# Patient Record
Sex: Male | Born: 1985 | Hispanic: Yes | Marital: Married | Smoking: Current some day smoker
Health system: Southern US, Community
[De-identification: ages and names within clinical notes are randomized; demographics above are authoritative.]

## PROBLEM LIST (undated history)

## (undated) DIAGNOSIS — K30 Functional dyspepsia: Secondary | ICD-10-CM

## (undated) HISTORY — PX: NO PAST SURGERIES: SHX2092

---

## 2019-07-02 ENCOUNTER — Ambulatory Visit: Payer: Self-pay | Attending: Internal Medicine

## 2019-07-02 DIAGNOSIS — Z23 Encounter for immunization: Secondary | ICD-10-CM

## 2019-07-02 NOTE — Progress Notes (Signed)
   Covid-19 Vaccination Clinic  Name:  Brandon Baird    MRN: [id number] DOB: 26-Jun-1985  07/02/2019  Mr. Brandon Baird was observed post Covid-19 immunization for 15 minutes without incident. He was provided with Vaccine Information Sheet and instruction to access the V-Safe system.   Mr. Brandon Baird was instructed to call 911 with any severe reactions post vaccine: Marland Kitchen Difficulty breathing  . Swelling of face and throat  . A fast heartbeat  . A bad rash all over body  . Dizziness and weakness   Immunizations Administered    Name Date Dose VIS Date Route   Pfizer COVID-19 Vaccine 07/02/2019  2:51 PM 0.3 mL 03/25/2019 Intramuscular   Manufacturer: [employer], [employer]   Lot: [id number]   NDC: [address]-9

## 2019-07-23 ENCOUNTER — Ambulatory Visit: Payer: Self-pay | Attending: Internal Medicine

## 2019-07-23 DIAGNOSIS — Z23 Encounter for immunization: Secondary | ICD-10-CM

## 2019-07-23 NOTE — Progress Notes (Signed)
   Covid-19 Vaccination Clinic  Name:  Brandon Baird    MRN: [id number] DOB: Dec 17, 1985  07/23/2019  Mr. Brandon Baird was observed post Covid-19 immunization for 15 minutes without incident. He was provided with Vaccine Information Sheet and instruction to access the V-Safe system.   Mr. Brandon Baird was instructed to call 911 with any severe reactions post vaccine: Marland Kitchen Difficulty breathing  . Swelling of face and throat  . A fast heartbeat  . A bad rash all over body  . Dizziness and weakness   Immunizations Administered    Name Date Dose VIS Date Route   Pfizer COVID-19 Vaccine 07/23/2019  3:09 AM 0.3 mL 03/25/2019 Intramuscular   Manufacturer: [employer], [employer]   Lot: [id number]   NDC: [address]-7

## 2020-01-05 ENCOUNTER — Emergency Department: Payer: Self-pay

## 2020-01-05 ENCOUNTER — Ambulatory Visit
Admission: EM | Admit: 2020-01-05 | Discharge: 2020-01-06 | Disposition: A | Discharge status: Home / Self Care | Payer: Self-pay | Attending: Surgery | Admitting: Surgery

## 2020-01-05 ENCOUNTER — Other Ambulatory Visit: Payer: Self-pay

## 2020-01-05 ENCOUNTER — Encounter: Payer: Self-pay | Admitting: Radiology

## 2020-01-05 DIAGNOSIS — K37 Unspecified appendicitis: Secondary | ICD-10-CM | POA: Diagnosis present

## 2020-01-05 DIAGNOSIS — F17200 Nicotine dependence, unspecified, uncomplicated: Secondary | ICD-10-CM | POA: Insufficient documentation

## 2020-01-05 DIAGNOSIS — K760 Fatty (change of) liver, not elsewhere classified: Secondary | ICD-10-CM | POA: Insufficient documentation

## 2020-01-05 DIAGNOSIS — K3580 Unspecified acute appendicitis: Secondary | ICD-10-CM | POA: Insufficient documentation

## 2020-01-05 DIAGNOSIS — K353 Acute appendicitis with localized peritonitis, without perforation or gangrene: Secondary | ICD-10-CM

## 2020-01-05 DIAGNOSIS — Z20822 Contact with and (suspected) exposure to covid-19: Secondary | ICD-10-CM | POA: Insufficient documentation

## 2020-01-05 HISTORY — DX: Functional dyspepsia: K30

## 2020-01-05 LAB — CBC
HCT: 44.7 % (ref 39.0–52.0)
Hemoglobin: 15.4 g/dL (ref 13.0–17.0)
MCH: 30.6 pg (ref 26.0–34.0)
MCHC: 34.5 g/dL (ref 30.0–36.0)
MCV: 88.7 fL (ref 80.0–100.0)
Platelets: 255 10*3/uL (ref 150–400)
RBC: 5.04 MIL/uL (ref 4.22–5.81)
RDW: 12 % (ref 11.5–15.5)
WBC: 8 10*3/uL (ref 4.0–10.5)
nRBC: 0 % (ref 0.0–0.2)

## 2020-01-05 LAB — RESPIRATORY PANEL BY RT PCR (FLU A&B, COVID)
Influenza A by PCR: NEGATIVE
Influenza B by PCR: NEGATIVE
SARS Coronavirus 2 by RT PCR: NEGATIVE

## 2020-01-05 LAB — COMPREHENSIVE METABOLIC PANEL
ALT: 60 U/L — ABNORMAL HIGH (ref 0–44)
AST: 27 U/L (ref 15–41)
Albumin: 4.6 g/dL (ref 3.5–5.0)
Alkaline Phosphatase: 49 U/L (ref 38–126)
Anion gap: 9 (ref 5–15)
BUN: 18 mg/dL (ref 6–20)
CO2: 27 mmol/L (ref 22–32)
Calcium: 9.1 mg/dL (ref 8.9–10.3)
Chloride: 100 mmol/L (ref 98–111)
Creatinine, Ser: 0.85 mg/dL (ref 0.61–1.24)
GFR calc Af Amer: >60 mL/min (ref >60)
GFR calc non Af Amer: >60 mL/min (ref >60)
Glucose, Bld: 106 mg/dL — ABNORMAL HIGH (ref 70–99)
Potassium: 4.2 mmol/L (ref 3.5–5.1)
Sodium: 136 mmol/L (ref 135–145)
Total Bilirubin: 0.7 mg/dL (ref 0.3–1.2)
Total Protein: 8 g/dL (ref 6.5–8.1)

## 2020-01-05 LAB — LIPASE, BLOOD: Lipase: 35 U/L (ref 11–51)

## 2020-01-05 MED ORDER — MORPHINE SULFATE (PF) 2 MG/ML IV SOLN: 2.0000 mg | INTRAVENOUS | Status: DC | PRN

## 2020-01-05 MED ORDER — ONDANSETRON 4 MG PO TBDP: 4.0000 mg | ORAL_TABLET | Freq: Four times a day (QID) | ORAL | Status: DC | PRN

## 2020-01-05 MED ORDER — ONDANSETRON HCL 4 MG/2ML IJ SOLN: 4.0000 mg | Freq: Four times a day (QID) | INTRAMUSCULAR | Status: DC | PRN

## 2020-01-05 MED ORDER — IOHEXOL 350 MG/ML SOLN
100.0000 mL | Freq: Once | INTRAVENOUS | Status: AC | PRN
  Administered 2020-01-05: 100 mL via INTRAVENOUS
  Filled 2020-01-05: qty 100

## 2020-01-05 MED ORDER — LACTATED RINGERS IV BOLUS
1000.0000 mL | Freq: Once | INTRAVENOUS | Status: AC
  Administered 2020-01-05: 1000 mL via INTRAVENOUS

## 2020-01-05 MED ORDER — FENTANYL CITRATE (PF) 100 MCG/2ML IJ SOLN
50.0000 ug | Freq: Once | INTRAMUSCULAR | Status: AC
  Administered 2020-01-05: 50 ug via INTRAVENOUS
  Filled 2020-01-05: qty 2

## 2020-01-05 MED ORDER — SODIUM CHLORIDE 0.9 % IV SOLN: INTRAVENOUS | Status: DC

## 2020-01-05 MED ORDER — SODIUM CHLORIDE 0.9 % IV SOLN
1.0000 g | INTRAVENOUS | Status: DC
  Administered 2020-01-05: 1000 mg via INTRAVENOUS
  Filled 2020-01-05 (×2): qty 1

## 2020-01-05 MED ORDER — HYDROCODONE-ACETAMINOPHEN 5-325 MG PO TABS: 1.0000 | ORAL_TABLET | ORAL | Status: DC | PRN

## 2020-01-05 NOTE — ED Notes (Signed)
See triage note  Presents with some lower abd pain  states pain is on the right side  No fever  Brought over from [facility]

## 2020-01-05 NOTE — ED Notes (Signed)
Pt with LRQ tenderness for "a couple days." Pt states he had this pain a couple weeks ago briefly, but it went away and is now getting worse. Pt denies n/v/d/fever

## 2020-01-05 NOTE — ED Provider Notes (Signed)
[facility] Emergency Department Provider Note  ____________________________________________   First MD Initiated Contact with Patient 01/05/20 1841     (approximate)  I have reviewed the triage vital signs and the nursing notes.   HISTORY  Chief Complaint Abdominal Pain   HPI Brandon Baird is a 34 y.o. male without significant past medical history who presents for assessment approximately 3 to 4 days of right lower quadrant abdominal pain.  No prior similar episodes.  No clear alleviating or aggravating factors.  Patient denies any fevers, chills, cough, nausea, vomiting, diarrhea, dysuria, burning with urination, rash, back pain, extremity pain, or other acute complaints.  No prior abdominal surgeries.  Patient denies EtOH abuse stating only drinks 2 alcoholic beverages on average per night, illicit drug use, or tobacco abuse.       History reviewed. No pertinent past medical history.  There are no problems to display for this patient.   Prior to Admission medications   Not on File    Allergies Patient has no allergy information on record.  No family history on file.  Social History Social History   Tobacco Use  . Smoking status: Not on file  Substance Use Topics  . Alcohol use: Not on file  . Drug use: Not on file    Review of Systems  Review of Systems  Constitutional: Negative for chills and fever.  HENT: Negative for sore throat.   Eyes: Negative for pain.  Respiratory: Negative for cough and stridor.   Cardiovascular: Negative for chest pain.  Gastrointestinal: Positive for abdominal pain. Negative for vomiting.  Genitourinary: Negative for dysuria.  Musculoskeletal: Negative for myalgias.  Skin: Negative for rash.  Neurological: Negative for seizures, loss of consciousness and headaches.  Psychiatric/Behavioral: Negative for suicidal ideas.  All other systems reviewed and are negative.      ____________________________________________   PHYSICAL EXAM:  VITAL SIGNS: ED Triage Vitals  Enc Vitals Group     BP 01/05/20 1644 (!) 151/79     Pulse Rate 01/05/20 1644 (!) 56     Resp 01/05/20 1644 18     Temp 01/05/20 1644 98.1 F (36.7 C)     Temp Source 01/05/20 1644 Oral     SpO2 01/05/20 1644 100 %     Weight 01/05/20 1645 198 lb (89.8 kg)     Height 01/05/20 1645 6' (1.829 m)     Head Circumference --      Peak Flow --      Pain Score 01/05/20 1648 5     Pain Loc --      Pain Edu? --      Excl. in GC? --    Vitals:   01/05/20 1644 01/05/20 1844  BP: (!) 151/79 116/72  Pulse: (!) 56 (!) 52  Resp: 18 16  Temp: 98.1 F (36.7 C)   SpO2: 100% 98%   Physical Exam Vitals and nursing note reviewed.  Constitutional:      Appearance: He is well-developed.  HENT:     Head: Normocephalic and atraumatic.     Right Ear: External ear normal.     Left Ear: External ear normal.     Nose: Nose normal.     Mouth/Throat:     Mouth: Mucous membranes are moist.  Eyes:     Conjunctiva/sclera: Conjunctivae normal.  Cardiovascular:     Rate and Rhythm: Regular rhythm. Bradycardia present.     Heart sounds: No murmur heard.   Pulmonary:  Effort: Pulmonary effort is normal. No respiratory distress.     Breath sounds: Normal breath sounds.  Abdominal:     Palpations: Abdomen is soft.     Tenderness: There is abdominal tenderness in the right lower quadrant. There is guarding. There is no right CVA tenderness or left CVA tenderness.  Musculoskeletal:     Cervical back: Neck supple.  Skin:    General: Skin is warm and dry.     Capillary Refill: Capillary refill takes less than 2 seconds.  Neurological:     Mental Status: He is alert and oriented to person, place, and time.  Psychiatric:        Mood and Affect: Mood normal.       ____________________________________________   LABS (all labs ordered are listed, but only abnormal results are displayed)  Labs  Reviewed  COMPREHENSIVE METABOLIC PANEL - Abnormal; Notable for the following components:      Result Value   Glucose, Bld 106 (*)    ALT 60 (*)    All other components within normal limits  RESPIRATORY PANEL BY RT PCR (FLU A&B, COVID)  LIPASE, BLOOD  CBC  URINALYSIS, COMPLETE (UACMP) WITH MICROSCOPIC   ____________________________________________  ____________________________________________  RADIOLOGY  ED MD interpretation:    Official radiology report(s): CT ABDOMEN PELVIS W CONTRAST  Result Date: 01/05/2020 CLINICAL DATA:  Right lower quadrant pain.  Appendicitis suspected. EXAM: CT ABDOMEN AND PELVIS WITH CONTRAST TECHNIQUE: Multidetector CT imaging of the abdomen and pelvis was performed using the standard protocol following bolus administration of intravenous contrast. CONTRAST:  OMNIPAQUE IOHEXOL 350 MG/ML SOLN COMPARISON:  None. FINDINGS: Lower chest: Hypoventilatory atelectasis in the dependent lungs with subsegmental lingular atelectasis. No confluent airspace disease or pleural effusion. Hepatobiliary: Diffusely decreased hepatic density typical of steatosis. No evidence of focal hepatic lesion. Gallbladder physiologically distended, no calcified stone. No biliary dilatation. Pancreas: No ductal dilatation or inflammation. Spleen: Normal in size without focal abnormality. Adrenals/Urinary Tract: Normal adrenal glands. No hydronephrosis or perinephric edema. Homogeneous renal enhancement. Urinary bladder is physiologically distended without wall thickening. Stomach/Bowel: Early acute appendicitis as described below. Tiny hiatal hernia, stomach otherwise unremarkable. There is no small bowel obstruction or inflammatory change. Terminal ileum is normal. Small volume of colonic stool. No colonic wall thickening or inflammatory change. Appendix: Location: Retrocecal Diameter: 8 mm Appendicolith: No Mucosal hyper-enhancement: No Extraluminal gas: No Periappendiceal collection: Faint  periappendiceal fat stranding but no collection or free fluid. Vascular/Lymphatic: Normal caliber abdominal aorta. Patent portal vein. Mesenteric vessels are patent. No abdominopelvic adenopathy. Reproductive: Prostate is unremarkable. Other: No free air, free fluid, or intra-abdominal fluid collection. Musculoskeletal: There are no acute or suspicious osseous abnormalities. IMPRESSION: 1. Findings consistent with early uncomplicated acute appendicitis. 2. Hepatic steatosis. Electronically Signed   By: Narda Rutherford M.D.   On: 01/05/2020 19:42    ____________________________________________   PROCEDURES  Procedure(s) performed (including Critical Care):  Procedures   ____________________________________________   INITIAL IMPRESSION / ASSESSMENT AND PLAN / ED COURSE        Patient presents with [place] to history exam for assessment of right lower quadrant pain.  Patient is afebrile otherwise hemodynamically stable on arrival with tenderness in the right lower quadrant.  Overall patient's presentation work-up is most consistent with early acute uncomplicated appendicitis without evidence of abscess or perforation.  There is no evidence of pancreatitis, pyelonephritis, kidney stone, cystitis, diverticulitis, or acute cholestasis.  Patient does not appear septic.  He was given IV fluids and below noted  analgesia.  Covid PCR was sent.  I did consult Dr. Zoila Shutter on surgery service who stated he would admit the patient.  ____________________________________________   FINAL CLINICAL IMPRESSION(S) / ED DIAGNOSES  Final diagnoses:  Acute appendicitis with localized peritonitis, without perforation, abscess, or gangrene    Medications  lactated ringers bolus 1,000 mL (1,000 mLs Intravenous New Bag/Given 01/05/20 1902)  fentaNYL (SUBLIMAZE) injection 50 mcg (50 mcg Intravenous Given 01/05/20 1902)  iohexol (OMNIPAQUE) 350 MG/ML injection 100 mL (100 mLs Intravenous Contrast Given 01/05/20 1920)       ED Discharge Orders    None       Note:  This document was prepared using Dragon voice recognition software and may include unintentional dictation errors.   Gilles Chiquito, MD 01/05/20 2020

## 2020-01-05 NOTE — ED Triage Notes (Signed)
Pt here with abd pain and was referred to come to the ED for evaluation of appendicitis. Pt states that he has lower right abd pain. Pt NAD in triage.

## 2020-01-06 ENCOUNTER — Encounter
Admission: EM | Disposition: A | Discharge status: Home / Self Care | Payer: Self-pay | Source: Home / Self Care | Attending: Emergency Medicine

## 2020-01-06 ENCOUNTER — Observation Stay: Payer: Self-pay | Admitting: Registered Nurse

## 2020-01-06 ENCOUNTER — Encounter: Payer: Self-pay | Admitting: Surgery

## 2020-01-06 DIAGNOSIS — K353 Acute appendicitis with localized peritonitis, without perforation or gangrene: Secondary | ICD-10-CM

## 2020-01-06 DIAGNOSIS — K3580 Unspecified acute appendicitis: Secondary | ICD-10-CM

## 2020-01-06 DIAGNOSIS — K35891 Other acute appendicitis without perforation, with gangrene: Secondary | ICD-10-CM

## 2020-01-06 HISTORY — DX: Unspecified acute appendicitis: K35.80

## 2020-01-06 HISTORY — PX: XI ROBOTIC LAPAROSCOPIC ASSISTED APPENDECTOMY: SHX6877

## 2020-01-06 LAB — URINALYSIS, COMPLETE (UACMP) WITH MICROSCOPIC
Bacteria, UA: NONE SEEN
Bilirubin Urine: NEGATIVE
Glucose, UA: NEGATIVE mg/dL
Hgb urine dipstick: NEGATIVE
Ketones, ur: NEGATIVE mg/dL
Leukocytes,Ua: NEGATIVE
Nitrite: NEGATIVE
Protein, ur: NEGATIVE mg/dL
Specific Gravity, Urine: 1.03 (ref 1.005–1.030)
Squamous Epithelial / HPF: NONE SEEN (ref 0–5)
pH: 6 (ref 5.0–8.0)

## 2020-01-06 LAB — HIV ANTIBODY (ROUTINE TESTING W REFLEX): HIV Screen 4th Generation wRfx: NONREACTIVE

## 2020-01-06 SURGERY — APPENDECTOMY, ROBOT-ASSISTED, LAPAROSCOPIC
Anesthesia: General

## 2020-01-06 MED ORDER — MIDAZOLAM HCL 2 MG/2ML IJ SOLN
INTRAMUSCULAR | Status: DC | PRN
  Administered 2020-01-06: 2 mg via INTRAVENOUS

## 2020-01-06 MED ORDER — FENTANYL CITRATE (PF) 100 MCG/2ML IJ SOLN
INTRAMUSCULAR | Status: AC
  Administered 2020-01-06: 25 ug via INTRAVENOUS
  Filled 2020-01-06: qty 2

## 2020-01-06 MED ORDER — IBUPROFEN 800 MG PO TABS: 800.0000 mg | ORAL_TABLET | Freq: Three times a day (TID) | ORAL | 0 refills | Status: AC | PRN

## 2020-01-06 MED ORDER — LIDOCAINE HCL (CARDIAC) PF 100 MG/5ML IV SOSY
PREFILLED_SYRINGE | INTRAVENOUS | Status: DC | PRN
  Administered 2020-01-06: 100 mg via INTRAVENOUS

## 2020-01-06 MED ORDER — ONDANSETRON HCL 4 MG/2ML IJ SOLN
INTRAMUSCULAR | Status: AC
  Filled 2020-01-06: qty 2

## 2020-01-06 MED ORDER — ROCURONIUM BROMIDE 10 MG/ML (PF) SYRINGE
PREFILLED_SYRINGE | INTRAVENOUS | Status: AC
  Filled 2020-01-06: qty 10

## 2020-01-06 MED ORDER — FENTANYL CITRATE (PF) 100 MCG/2ML IJ SOLN
25.0000 ug | INTRAMUSCULAR | Status: DC | PRN
  Administered 2020-01-06 (×3): 25 ug via INTRAVENOUS

## 2020-01-06 MED ORDER — ONDANSETRON HCL 4 MG/2ML IJ SOLN
INTRAMUSCULAR | Status: DC | PRN
  Administered 2020-01-06: 4 mg via INTRAVENOUS

## 2020-01-06 MED ORDER — CHLORHEXIDINE GLUCONATE 0.12 % MT SOLN
OROMUCOSAL | Status: AC
  Filled 2020-01-06: qty 15

## 2020-01-06 MED ORDER — SUGAMMADEX SODIUM 200 MG/2ML IV SOLN
INTRAVENOUS | Status: DC | PRN
  Administered 2020-01-06: 179.6 mg via INTRAVENOUS

## 2020-01-06 MED ORDER — ORAL CARE MOUTH RINSE: 15.0000 mL | Freq: Once | OROMUCOSAL | Status: AC

## 2020-01-06 MED ORDER — GLYCOPYRROLATE 0.2 MG/ML IJ SOLN
INTRAMUSCULAR | Status: DC | PRN
  Administered 2020-01-06: .2 mg via INTRAVENOUS

## 2020-01-06 MED ORDER — ACETAMINOPHEN 10 MG/ML IV SOLN
INTRAVENOUS | Status: AC
  Filled 2020-01-06: qty 100

## 2020-01-06 MED ORDER — PROPOFOL 10 MG/ML IV BOLUS
INTRAVENOUS | Status: DC | PRN
  Administered 2020-01-06: 200 mg via INTRAVENOUS

## 2020-01-06 MED ORDER — DEXAMETHASONE SODIUM PHOSPHATE 10 MG/ML IJ SOLN
INTRAMUSCULAR | Status: DC | PRN
  Administered 2020-01-06: 10 mg via INTRAVENOUS

## 2020-01-06 MED ORDER — FENTANYL CITRATE (PF) 100 MCG/2ML IJ SOLN
INTRAMUSCULAR | Status: AC
  Filled 2020-01-06: qty 2

## 2020-01-06 MED ORDER — ACETAMINOPHEN 10 MG/ML IV SOLN
INTRAVENOUS | Status: DC | PRN
  Administered 2020-01-06: 1000 mg via INTRAVENOUS

## 2020-01-06 MED ORDER — DEXAMETHASONE SODIUM PHOSPHATE 10 MG/ML IJ SOLN
INTRAMUSCULAR | Status: AC
  Filled 2020-01-06: qty 1

## 2020-01-06 MED ORDER — FENTANYL CITRATE (PF) 100 MCG/2ML IJ SOLN
INTRAMUSCULAR | Status: DC | PRN
Start: 2020-01-06 — End: 2020-01-06
  Administered 2020-01-06: 50 ug via INTRAVENOUS
  Administered 2020-01-06 (×2): 25 ug via INTRAVENOUS

## 2020-01-06 MED ORDER — HYDROCODONE-ACETAMINOPHEN 5-325 MG PO TABS
1.0000 | ORAL_TABLET | ORAL | 0 refills | Status: AC | PRN
Start: 2020-01-06 — End: ?

## 2020-01-06 MED ORDER — BUPIVACAINE LIPOSOME 1.3 % IJ SUSP
INTRAMUSCULAR | Status: DC | PRN
  Administered 2020-01-06: 20 mL

## 2020-01-06 MED ORDER — CHLORHEXIDINE GLUCONATE 0.12 % MT SOLN
15.0000 mL | Freq: Once | OROMUCOSAL | Status: AC
  Administered 2020-01-06: 15 mL via OROMUCOSAL

## 2020-01-06 MED ORDER — ONDANSETRON HCL 4 MG/2ML IJ SOLN: 4.0000 mg | Freq: Once | INTRAMUSCULAR | Status: DC | PRN

## 2020-01-06 MED ORDER — LIDOCAINE HCL (PF) 2 % IJ SOLN
INTRAMUSCULAR | Status: AC
  Filled 2020-01-06: qty 5

## 2020-01-06 MED ORDER — BUPIVACAINE-EPINEPHRINE (PF) 0.25% -1:200000 IJ SOLN
INTRAMUSCULAR | Status: DC | PRN
  Administered 2020-01-06: 30 mL

## 2020-01-06 MED ORDER — GLYCOPYRROLATE 0.2 MG/ML IJ SOLN
INTRAMUSCULAR | Status: AC
  Filled 2020-01-06: qty 1

## 2020-01-06 MED ORDER — LACTATED RINGERS IV SOLN: INTRAVENOUS | Status: DC | PRN

## 2020-01-06 MED ORDER — PROPOFOL 10 MG/ML IV BOLUS
INTRAVENOUS | Status: AC
  Filled 2020-01-06: qty 20

## 2020-01-06 MED ORDER — ROCURONIUM BROMIDE 100 MG/10ML IV SOLN
INTRAVENOUS | Status: DC | PRN
  Administered 2020-01-06: 50 mg via INTRAVENOUS

## 2020-01-06 MED ORDER — MIDAZOLAM HCL 2 MG/2ML IJ SOLN
INTRAMUSCULAR | Status: AC
  Filled 2020-01-06: qty 2

## 2020-01-06 SURGICAL SUPPLY — 38 items
BLADE CLIPPER SURG (BLADE) ×3 IMPLANT
CHLORAPREP W/TINT 26 (MISCELLANEOUS) ×3 IMPLANT
COVER TIP SHEARS 8 DVNC (MISCELLANEOUS) ×1 IMPLANT
COVER TIP SHEARS 8MM DA VINCI (MISCELLANEOUS) ×2
COVER WAND RF STERILE (DRAPES) ×3 IMPLANT
DECANTER SPIKE VIAL GLASS SM (MISCELLANEOUS) ×3 IMPLANT
DEFOGGER SCOPE WARMER CLEARIFY (MISCELLANEOUS) ×3 IMPLANT
DERMABOND ADVANCED (GAUZE/BANDAGES/DRESSINGS) ×2
DERMABOND ADVANCED .7 DNX12 (GAUZE/BANDAGES/DRESSINGS) ×1 IMPLANT
DRAPE ARM DVNC X/XI (DISPOSABLE) ×4 IMPLANT
DRAPE COLUMN DVNC XI (DISPOSABLE) ×1 IMPLANT
DRAPE DA VINCI XI ARM (DISPOSABLE) ×8
DRAPE DA VINCI XI COLUMN (DISPOSABLE) ×2
GLOVE ORTHO TXT STRL SZ7.5 (GLOVE) ×6 IMPLANT
GOWN STRL REUS W/ TWL LRG LVL3 (GOWN DISPOSABLE) ×4 IMPLANT
GOWN STRL REUS W/TWL LRG LVL3 (GOWN DISPOSABLE) ×8
GRASPER SUT TROCAR 14GX15 (MISCELLANEOUS) ×3 IMPLANT
KIT PINK PAD W/HEAD ARE REST (MISCELLANEOUS) ×3
KIT PINK PAD W/HEAD ARM REST (MISCELLANEOUS) ×1 IMPLANT
KIT TURNOVER KIT A (KITS) ×3 IMPLANT
LABEL OR SOLS (LABEL) ×3 IMPLANT
NEEDLE HYPO 22GX1.5 SAFETY (NEEDLE) ×3 IMPLANT
NEEDLE INSUFFLATION 14GA 120MM (NEEDLE) ×3 IMPLANT
NS IRRIG 500ML POUR BTL (IV SOLUTION) ×3 IMPLANT
PACK LAP CHOLECYSTECTOMY (MISCELLANEOUS) ×3 IMPLANT
POUCH SPECIMEN RETRIEVAL 10MM (ENDOMECHANICALS) ×3 IMPLANT
SEAL CANN UNIV 5-8 DVNC XI (MISCELLANEOUS) ×3 IMPLANT
SEAL XI 5MM-8MM UNIVERSAL (MISCELLANEOUS) ×6
SET TUBE SMOKE EVAC HIGH FLOW (TUBING) ×3 IMPLANT
SOLUTION ELECTROLUBE (MISCELLANEOUS) ×3 IMPLANT
SUT MNCRL 4-0 (SUTURE) ×4
SUT MNCRL 4-0 27XMFL (SUTURE) ×2
SUT VIC AB 2-0 SH 27 (SUTURE) ×2
SUT VIC AB 2-0 SH 27XBRD (SUTURE) ×1 IMPLANT
SUT VICRYL 0 AB UR-6 (SUTURE) ×3 IMPLANT
SUTURE MNCRL 4-0 27XMF (SUTURE) ×2 IMPLANT
TRAY FOLEY SLVR 16FR LF STAT (SET/KITS/TRAYS/PACK) ×3 IMPLANT
TROCAR Z-THREAD FIOS 11X100 BL (TROCAR) ×3 IMPLANT

## 2020-01-06 NOTE — Transfer of Care (Signed)
Immediate Anesthesia Transfer of Care Note  Patient: [facility]  Procedure(s) Performed: Procedure(s): XI ROBOTIC LAPAROSCOPIC ASSISTED APPENDECTOMY (N/A)  Patient Location: PACU  Anesthesia Type:General  Level of Consciousness: sedated  Airway & Oxygen Therapy: Patient Spontanous Breathing and Patient connected to face mask oxygen  Post-op Assessment: Report given to RN and Post -op Vital signs reviewed and stable  Post vital signs: Reviewed and stable  Last Vitals:  Vitals:   01/06/20 1143 01/06/20 1340  BP: 135/74 131/71  Pulse: 74 95  Resp: 14 17  Temp: 36.8 C (P) 36.4 C  SpO2: 100% 100%    Complications: No apparent anesthesia complications

## 2020-01-06 NOTE — Anesthesia Postprocedure Evaluation (Signed)
Anesthesia Post Note  Patient: Brandon Baird  Procedure(s) Performed: XI ROBOTIC LAPAROSCOPIC ASSISTED APPENDECTOMY (N/A )  Patient location during evaluation: PACU Anesthesia Type: General Level of consciousness: awake and alert Pain management: pain level controlled Vital Signs Assessment: post-procedure vital signs reviewed and stable Respiratory status: spontaneous breathing and respiratory function stable Cardiovascular status: stable Anesthetic complications: no   No complications documented.   Last Vitals:  Vitals:   01/06/20 1143 01/06/20 1340  BP: 135/74 131/71  Pulse: 74 95  Resp: 14 17  Temp: 36.8 C 36.4 C  SpO2: 100% 100%    Last Pain:  Vitals:   01/06/20 1143  TempSrc: Temporal  PainSc: 0-No pain                 Carlosdaniel Grob K

## 2020-01-06 NOTE — Anesthesia Procedure Notes (Signed)
Procedure Name: Intubation Date/Time: 01/06/2020 12:06 PM Performed by: Doreen Salvage, CRNA Pre-anesthesia Checklist: Patient identified, Patient being monitored, Timeout performed, Emergency Drugs available and Suction available Patient Re-evaluated:Patient Re-evaluated prior to induction Oxygen Delivery Method: Circle system utilized Preoxygenation: Pre-oxygenation with 100% oxygen Induction Type: IV induction Ventilation: Mask ventilation without difficulty Laryngoscope Size: Mac and 4 Grade View: Grade I Tube type: Oral Tube size: 7.5 mm Number of attempts: 1 Airway Equipment and Method: Stylet Placement Confirmation: ETT inserted through vocal cords under direct vision,  positive ETCO2 and breath sounds checked- equal and bilateral Secured at: 23 cm Tube secured with: Tape Dental Injury: Teeth and Oropharynx as per pre-operative assessment

## 2020-01-06 NOTE — ED Notes (Signed)
Transport arrived to take pt for procedure. Pt surgical consent for completed and sent with pt to procedure.

## 2020-01-06 NOTE — Op Note (Signed)
Robotic appendectomy  Pre-operative Diagnosis: Acute appendicitis  Post-operative Diagnosis: same.    Surgeon: Campbell Lerner, M.D., FACS  Anesthesia: General  Findings: As expected acutely inflamed appendix with normal serosa at the appendical cecal junction.  Estimated Blood Loss: 10 mL         Specimens: Appendix          Complications: none              Procedure Details  The patient was seen again in the Holding Room. The benefits, complications, treatment options, and expected outcomes were discussed with the patient. The risks of bleeding, infection, recurrence of symptoms, failure to resolve symptoms, unanticipated injury, prosthetic placement, prosthetic infection, any of which could require further surgery were reviewed with the patient. The likelihood of improving the patient's symptoms with return to their baseline status is good.  The patient and/or family concurred with the proposed plan, giving informed consent.  The patient was taken to Operating Room, identified and the procedure verified.    Prior to the induction of general anesthesia, antibiotic prophylaxis was administered. VTE prophylaxis was in place.  General anesthesia was then administered and tolerated well. After the induction, the patient was positioned in the supine position and the abdomen was prepped with Chloraprep and draped in the sterile fashion.  A Time Out was held and the above information confirmed.  After local infiltration of quarter percent Marcaine with epinephrine, stab incision was made left upper quadrant.  Just below the costal margin approximately midclavicular line the Veress needle is passed with sensation of the layers to penetrate the abdominal wall and into the peritoneum.  Saline drop test is confirmed peritoneal placement.  Insufflation is initiated with carbon dioxide to pressures of 15 mmHg.  With local anesthetic infiltration, a left lower quadrant incision is made, and an optical  12 mm trocar is passed into the peritoneal cavity under direct visualization.  An additional 8.5 mm robotic trochars placed in the suprapubic area and in the left abdominal wall under direct visualization. Using a force bipolar grasper and monopolar scissors proceeded with dissecting out the soft tissues adjacent to the cecum and appendix to fully identify the appendix, and mobilize it. The mesoappendix was carefully divided utilizing bipolar cautery, monopolar cautery and scissors. With the appendiceal cecal junction fully isolated, the appendiceal base was crimped, a double ligature of 2-0 Vicryl was utilized to ligate the base of the appendix, and the appendix was divided with monopolar scissors, the appendiceal mucosa was then fulgurated with the same. I utilized a 2-0 Vicryl to dunk the appendiceal stump into the cecal serosa, burying the stump with a pursestring suture.  We then placed the appendix in a retrieval bag.  We then undocked the robot and proceeded with completing the procedure laparoscopically and withdrew the Endo Catch bag out the largest port site. We closed the largest port site utilizing PMI and cone with a 0 Vicryl under direct visualization.  The abdomen was then desufflated and the trochars removed. Incisions were then irrigated and closed with subcuticulars of 4-0 Monocryl.  Skin sealed with Dermabond.  Patient tolerated procedure well.    Campbell Lerner M.D., [facility] 01/06/2020 1:32 PM

## 2020-01-06 NOTE — Progress Notes (Signed)
Pt has active order for admission/bed request as well as discharge orders from Dr. Claudine Mouton. Clarified with MD; per Dr. Claudine Mouton, pt will discharge home today. Bed request cancelled.

## 2020-01-06 NOTE — Discharge Summary (Signed)
Physician Discharge Summary  Patient ID: Brandon Baird MRN: [id number] DOB/AGE: 34-Mar-1987 34 y.o.  Admit date: 01/05/2020 Discharge date: 01/06/2020  Admission Diagnoses:  Appendicitis  Discharge Diagnoses:  Active Problems:   Appendicitis   Discharged Condition: good  Hospital Course: Admitted for robotic appendectomy.    Consults: None  Significant Diagnostic Studies: ED workup of CT/Labs  Treatments: surgery: Robotic appendectomy.   Discharge Exam: Blood pressure 131/71, pulse 87, temperature 97.6 F (36.4 C), resp. rate 13, height 6' (1.829 m), weight 89.8 kg, SpO2 100 %. GI: soft, non-tender; bowel sounds normal; no masses,  no organomegaly Incision/Wound: Dermabond intact on 3 incision.   Disposition: Discharge disposition: 01-Home or Self Care       Discharge Instructions    Call MD for:  persistant nausea and vomiting   Complete by: As directed    Call MD for:  redness, tenderness, or signs of infection (pain, swelling, redness, odor or green/yellow discharge around incision site)   Complete by: As directed    Call MD for:  severe uncontrolled pain   Complete by: As directed    Diet - low sodium heart healthy   Complete by: As directed    Discharge wound care:   Complete by: As directed    Your incision was closed with Dermabond.  It is best to keep it clean and dry, it will tolerate a brief shower, but do not soak it or apply any creams or lotions to the incisions.  The Dermabond should gradually flake off over time.  Keep it open to air so you can evaluate your incisions.  Dermabond assists the underlying sutures to keep your incision closed and protected from infection.  Should you develop some drainage from your incision, some drops of drainage would be okay but if it persists continue to put keep a dry dressing over it.   Driving Restrictions   Complete by: As directed    No driving until cleared after follow-up appointment.  Is not  advised to drive while taking narcotic pain medications or in significant pain.   Increase activity slowly   Complete by: As directed    Lifting restrictions   Complete by: As directed    Strongly advised against any form of lifting greater than 15 pounds over the next 4 to 6 weeks.  This involves pushing/pulling movements as well.  After 4 weeks when may gradually engage in more activities remaining aware of any new pain/tenderness elicited, and avoiding those for the full duration of 6 weeks.  Walking is encouraged.  Climbing stairs with caution.     Allergies as of 01/06/2020   No Known Allergies     Medication List    TAKE these medications   HYDROcodone-acetaminophen 5-325 MG tablet Commonly known as: NORCO/VICODIN Take 1-2 tablets by mouth every 4 (four) hours as needed for moderate pain.   ibuprofen 800 MG tablet Commonly known as: ADVIL Take 1 tablet (800 mg total) by mouth every 8 (eight) hours as needed.            Discharge Care Instructions  (From admission, onward)         Start     Ordered   01/06/20 0000  Discharge wound care:       Comments: Your incision was closed with Dermabond.  It is best to keep it clean and dry, it will tolerate a brief shower, but do not soak it or apply any creams or lotions to the incisions.  The Dermabond should gradually flake off over time.  Keep it open to air so you can evaluate your incisions.  Dermabond assists the underlying sutures to keep your incision closed and protected from infection.  Should you develop some drainage from your incision, some drops of drainage would be okay but if it persists continue to put keep a dry dressing over it.   01/06/20 1340          Follow-up Information    Campbell Lerner, MD Follow up in 2 week(s).   Specialty: General Surgery Contact information: [address] [place] [address] [phone]               Signed: Campbell Lerner, M.D., [facility] 01/06/2020, 1:58 PM

## 2020-01-06 NOTE — Discharge Instructions (Signed)

## 2020-01-06 NOTE — Anesthesia Preprocedure Evaluation (Signed)
Anesthesia Evaluation  Patient identified by MRN, date of birth, ID band Patient awake    Reviewed: Allergy & Precautions, NPO status , Patient's Chart, lab work & pertinent test results  History of Anesthesia Complications Negative for: history of anesthetic complications  Airway Mallampati: III       Dental   Pulmonary neg sleep apnea, neg COPD, Current Smoker,           Cardiovascular (-) hypertension(-) Past MI and (-) CHF (-) dysrhythmias (-) Valvular Problems/Murmurs     Neuro/Psych neg Seizures    GI/Hepatic Neg liver ROS, GERD  ,  Endo/Other  neg diabetes  Renal/GU negative Renal ROS     Musculoskeletal   Abdominal   Peds  Hematology   Anesthesia Other Findings   Reproductive/Obstetrics                             Anesthesia Physical Anesthesia Plan  ASA: II  Anesthesia Plan: General   Post-op Pain Management:    Induction: Intravenous  PONV Risk Score and Plan: 1 and Ondansetron  Airway Management Planned: Oral ETT  Additional Equipment:   Intra-op Plan:   Post-operative Plan:   Informed Consent: I have reviewed the patients History and Physical, chart, labs and discussed the procedure including the risks, benefits and alternatives for the proposed anesthesia with the patient or authorized representative who has indicated his/her understanding and acceptance.       Plan Discussed with:   Anesthesia Plan Comments:         Anesthesia Quick Evaluation

## 2020-01-06 NOTE — H&P (Signed)
[facility] SURGICAL HISTORY & PHYSICAL (cpt [phone])  HISTORY OF PRESENT ILLNESS (HPI):  History obtained with the help of Spanish-English medical interpreter.   34 y.o. male presented to [facility] ED yesterday (09/23) for abdominal pain. Patient reports a history of 3-4 days of abdominal pain primarily located in this RLQ which did not radiate. The pain was fairly constant and continued to intensify. Nothing seemed to provide him and relief. Palpation and movement exacerbated the pain. He denied any history of similar pain in the past. No associated fever, chills, cough, CP, SOB, nausea, emesis, urinary changes, or bowel changes reported. No previous intra-abdominal surgeries. Work up in the ED revealed very reassuring labs without evidence of leukocytosis; however, CT Abdomen/Pelvis was concerning for acute uncomplicated appendicitis.   General surgery is consulted by emergency medicine physician Dr Antoine Primas, MD for evaluation and management of acute appendicitis.   PAST MEDICAL HISTORY (PMH):  History reviewed. No pertinent past medical history.  Reviewed. Otherwise negative.   PAST SURGICAL HISTORY (PSH):  Reviewed. Otherwise negative.   MEDICATIONS:  Prior to Admission medications   Not on File     ALLERGIES:  Not on File   SOCIAL HISTORY:  Social History   Socioeconomic History  . Marital status: Married    Spouse name: Not on file  . Number of children: Not on file  . Years of education: Not on file  . Highest education level: Not on file  Occupational History  . Not on file  Tobacco Use  . Smoking status: Not on file  Substance and Sexual Activity  . Alcohol use: Not on file  . Drug use: Not on file  . Sexual activity: Not on file  Other Topics Concern  . Not on file  Social History Narrative  . Not on file   Social Determinants of Health   Financial Resource Strain:   . Difficulty of Paying Living Expenses: Not on file  Food Insecurity:   .  Worried About Programme researcher, broadcasting/film/video in the Last Year: Not on file  . Ran Out of Food in the Last Year: Not on file  Transportation Needs:   . Lack of Transportation (Medical): Not on file  . Lack of Transportation (Non-Medical): Not on file  Physical Activity:   . Days of Exercise per Week: Not on file  . Minutes of Exercise per Session: Not on file  Stress:   . Feeling of Stress : Not on file  Social Connections:   . Frequency of Communication with Friends and Family: Not on file  . Frequency of Social Gatherings with Friends and Family: Not on file  . Attends Religious Services: Not on file  . Active Member of Clubs or Organizations: Not on file  . Attends Banker Meetings: Not on file  . Marital Status: Not on file  Intimate Partner Violence:   . Fear of Current or Ex-Partner: Not on file  . Emotionally Abused: Not on file  . Physically Abused: Not on file  . Sexually Abused: Not on file     FAMILY HISTORY:  No family history on file.  Otherwise negative.   REVIEW OF SYSTEMS:  Review of Systems  Constitutional: Negative for chills and fever.  HENT: Negative for congestion and sore throat.   Respiratory: Negative for cough and shortness of breath.   Cardiovascular: Negative for chest pain and palpitations.  Gastrointestinal: Positive for abdominal pain. Negative for blood in stool, constipation, diarrhea, nausea and vomiting.  Genitourinary: Negative for dysuria and urgency.  All other systems reviewed and are negative.   VITAL SIGNS:  Temp:  [98.1 F (36.7 C)] 98.1 F (36.7 C) (09/23 1644) Pulse Rate:  [50-58] 51 (09/24 0657) Resp:  [13-18] 14 (09/24 0657) BP: (97-151)/(54-79) 105/63 (09/24 0657) SpO2:  [97 %-100 %] 98 % (09/24 0657) Weight:  [89.8 kg] 89.8 kg (09/23 1645)     Height: 6' (182.9 cm) Weight: 89.8 kg BMI (Calculated): 26.85   PHYSICAL EXAM:  Physical Exam Vitals and nursing note reviewed. Exam conducted with a chaperone present.   Constitutional:      General: He is not in acute distress.    Appearance: He is well-developed and normal weight. He is not ill-appearing.  HENT:     Head: Normocephalic and atraumatic.  Eyes:     General: No scleral icterus.    Extraocular Movements: Extraocular movements intact.  Cardiovascular:     Rate and Rhythm: Normal rate and regular rhythm.     Heart sounds: Normal heart sounds. No murmur heard.   Pulmonary:     Effort: Pulmonary effort is normal. No respiratory distress.     Breath sounds: Normal breath sounds.  Abdominal:     General: Abdomen is flat. There is no distension.     Palpations: Abdomen is soft.     Tenderness: There is abdominal tenderness in the right lower quadrant. There is no guarding or rebound.  Genitourinary:    Comments: Deferred Skin:    General: Skin is warm and dry.     Coloration: Skin is not jaundiced or pale.  Neurological:     General: No focal deficit present.     Mental Status: He is alert and oriented to person, place, and time.  Psychiatric:        Mood and Affect: Mood normal.        Behavior: Behavior normal.     INTAKE/OUTPUT:  This shift: No intake/output data recorded.  Last 2 shifts: @IOLAST2SHIFTS @  Labs:  CBC Latest Ref Rng & Units 01/05/2020  WBC 4.0 - 10.5 K/uL 8.0  Hemoglobin 13.0 - 17.0 g/dL 01/07/2020  Hematocrit 39 - 52 % 44.7  Platelets 150 - 400 K/uL 255   CMP Latest Ref Rng & Units 01/05/2020  Glucose 70 - 99 mg/dL 01/07/2020)  BUN 6 - 20 mg/dL 18  Creatinine 130(Q - 6.57 mg/dL 8.46  Sodium 9.62 - 952 mmol/L 136  Potassium 3.5 - 5.1 mmol/L 4.2  Chloride 98 - 111 mmol/L 100  CO2 22 - 32 mmol/L 27  Calcium 8.9 - 10.3 mg/dL 9.1  Total Protein 6.5 - 8.1 g/dL 8.0  Total Bilirubin 0.3 - 1.2 mg/dL 0.7  Alkaline Phos 38 - 126 U/L 49  AST 15 - 41 U/L 27  ALT 0 - 44 U/L 60(H)     Imaging studies:   CT Abdomen/Pelvis (01/05/2020) personally reviewed showing evidence of acute appendicitis without perforation nor  abscess, and radiologist report reviewed below:  IMPRESSION: 1. Findings consistent with early uncomplicated acute appendicitis. 2. Hepatic steatosis.   Assessment/Plan: (ICD-10's: K35.30) 34 y.o. male with RLQ abdominal pain found to have acute uncomplicated appendicitis.   - Admit to general surgery  - Will plan on robotic assisted laparoscopic appendectomy with Dr 32 this afternoon pending OR/Anesthesia availability  - With the help of spanish-english medical interpretor.Claudine MoutonMarland KitchenMarland KitchenAll risks, benefits, and alternatives to above procedure(s) were discussed with the patient, all of his questions were answered to his expressed satisfaction, patient expresses  he wishes to proceed, and informed consent was obtained.     - NPO + IVF resuscitation  - IV Abx (Ertepenam)   - Pain control prn; antiemetics prn  - Monitor abdominal examination  - Mobilization as toelrates   - DVT prophylaxis; hold for OR  All of the above findings and recommendations were discussed with the patient, and all of his questions were answered to his expressed satisfaction.  -- Lynden Oxford, PA-C [facility] 01/06/2020, 7:40 AM [phone] M-F: 7am - 4pm

## 2020-01-07 ENCOUNTER — Encounter: Payer: Self-pay | Admitting: Surgery

## 2020-01-09 LAB — SURGICAL PATHOLOGY

## 2020-01-26 ENCOUNTER — Ambulatory Visit (INDEPENDENT_AMBULATORY_CARE_PROVIDER_SITE_OTHER): Payer: Self-pay | Admitting: Surgery

## 2020-01-26 ENCOUNTER — Encounter: Payer: Self-pay | Admitting: Surgery

## 2020-01-26 ENCOUNTER — Other Ambulatory Visit: Payer: Self-pay

## 2020-01-26 VITALS — BP 142/84 | HR 61 | Temp 98.1°F | Ht 67.0 in | Wt 197.0 lb

## 2020-01-26 DIAGNOSIS — Z9049 Acquired absence of other specified parts of digestive tract: Secondary | ICD-10-CM

## 2020-01-26 NOTE — Progress Notes (Signed)
[facility] POST-OP OFFICE VISIT  01/26/2020  HPI: Jameison Haji Taysen Bushart is a 34 y.o. male 3 weeks s/p robotic appendectomy.  No complaints, denies fevers and chills, denies diarrhea or abdominal pain.  Reports he has been back to work already, doing only walking, no heavy lifting required.  Interpreter present.  Vital signs: BP (!) 142/84   Pulse 61   Temp 98.1 F (36.7 C) (Oral)   Ht 5\' 7"  (1.702 m)   Wt 197 lb (89.4 kg)   SpO2 96%   BMI 30.85 kg/m    Physical Exam: Constitutional: He appears well. Abdomen: Benign, soft and nontender. Skin: All incisions are clean dry and intact with Dermabond progressively peeling away.  Assessment/Plan: This is a 34 y.o. male 3 weeks s/p robotic appendectomy.  Apparently having an excellent recovery.  Patient Active Problem List   Diagnosis Date Noted  . Appendicitis 01/05/2020    -Advised he may follow-up, as needed with any concerns related to surgery.  Otherwise no routine follow-up necessary.   01/07/2020 M.D., FACS 01/26/2020, 3:42 PM

## 2020-01-26 NOTE — Patient Instructions (Addendum)
Follow-up with our office as needed.Please call and ask to speak with a nurse if you develop questions or concerns.  GENERAL POST-OPERATIVE PATIENT INSTRUCTIONS   WOUND CARE INSTRUCTIONS:  Keep a dry clean dressing on the wound if there is drainage. The initial bandage may be removed after 24 hours.  Once the wound has quit draining you may leave it open to air.  If clothing rubs against the wound or causes irritation and the wound is not draining you may cover it with a dry dressing during the daytime.  Try to keep the wound dry and avoid ointments on the wound unless directed to do so.  If the wound becomes bright red and painful or starts to drain infected material that is not clear, please contact your physician immediately.  If the wound is mildly pink and has a thick firm ridge underneath it, this is normal, and is referred to as a healing ridge.  This will resolve over the next 4-6 weeks.  BATHING: You may shower if you have been informed of this by your surgeon. However, Please do not submerge in a tub, hot tub, or pool until incisions are completely sealed or have been told by your surgeon that you may do so.  DIET:  You may eat any foods that you can tolerate.  It is a good idea to eat a high fiber diet and take in plenty of fluids to prevent constipation.  If you do become constipated you may want to take a mild laxative or take ducolax tablets on a daily basis until your bowel habits are regular.  Constipation can be very uncomfortable, along with straining, after recent surgery.  ACTIVITY:  You are encouraged to cough and deep breath or use your incentive spirometer if you were given one, every 15-30 minutes when awake.  This will help prevent respiratory complications and low grade fevers post-operatively if you had a general anesthetic.  You may want to hug a pillow when coughing and sneezing to add additional support to the surgical area, if you had abdominal or chest surgery, which will  decrease pain during these times.  You are encouraged to walk and engage in light activity for the next two weeks.  You should not lift more than 20 pounds, until 4 to 6 weeks after surgery as it could put you at increased risk for complications.  Twenty pounds is roughly equivalent to a plastic bag of groceries. At that time- Listen to your body when lifting, if you have pain when lifting, stop and then try again in a few days. Soreness after doing exercises or activities of daily living is normal as you get back in to your normal routine.  MEDICATIONS:  Try to take narcotic medications and anti-inflammatory medications, such as tylenol, ibuprofen, naprosyn, etc., with food.  This will minimize stomach upset from the medication.  Should you develop nausea and vomiting from the pain medication, or develop a rash, please discontinue the medication and contact your physician.  You should not drive, make important decisions, or operate machinery when taking narcotic pain medication.  SUNBLOCK Use sun block to incision area over the next year if this area will be exposed to sun. This helps decrease scarring and will allow you avoid a permanent darkened area over your incision.  QUESTIONS:  Please feel free to call our office if you have any questions, and we will be glad to assist you. [phone].    

## 2021-09-02 IMAGING — CT CT ABD-PELV W/ CM
2 of 4 series · 16 of 46 positions shown, 18 images · IV contrast (APPLIED)
Comparison: None.

CLINICAL DATA: Right lower quadrant pain.  Appendicitis suspected.

EXAM:
CT ABDOMEN AND PELVIS WITH CONTRAST
TECHNIQUE: Multidetector CT imaging of the abdomen and pelvis was performed
using the standard protocol following bolus administration of
intravenous contrast.
CONTRAST:  100mL OMNIPAQUE IOHEXOL 350 MG/ML SOLN

[Series 2: routine abd/pel with · axial · 0.77mm/px · z∈[-262,+188]mm · 13 of 98 slices shown, 15 images]
[im 4/98  soft-tissue]
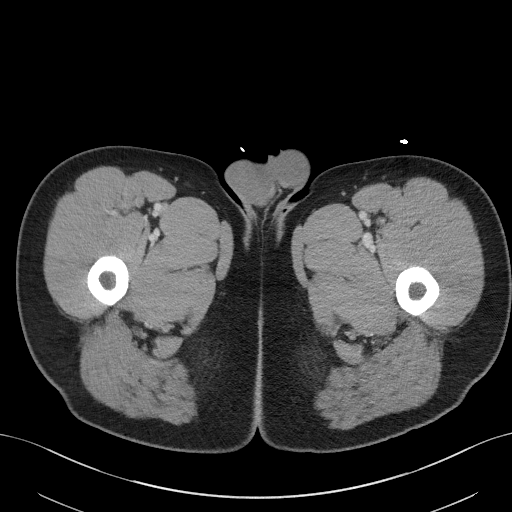
[im 4/98  bone]
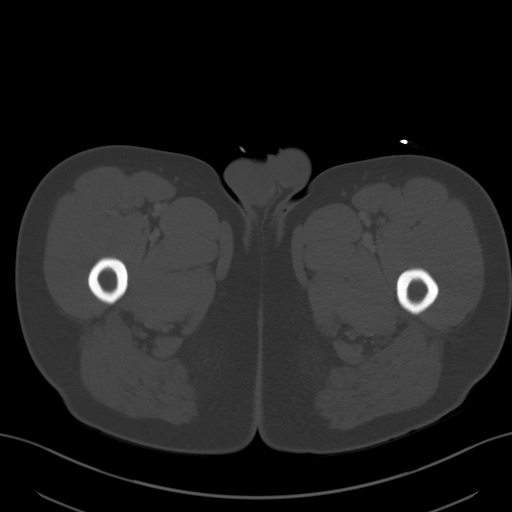
[im 12/98  soft-tissue]
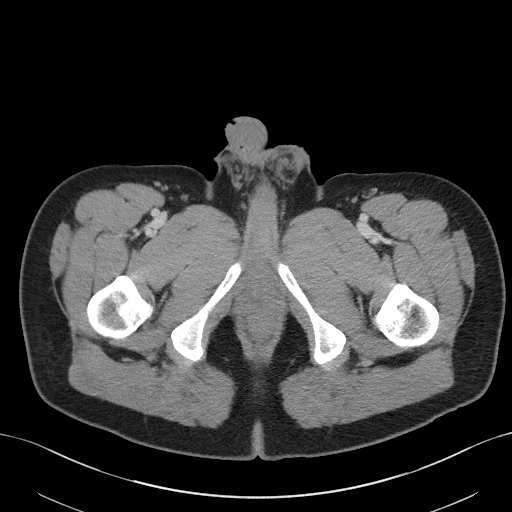
[im 19/98  soft-tissue]
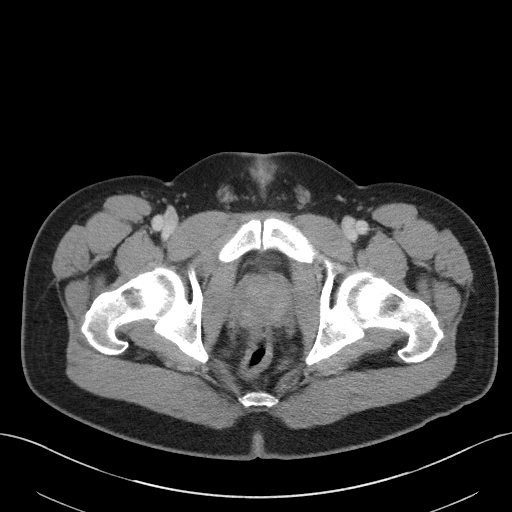
[im 27/98  soft-tissue]
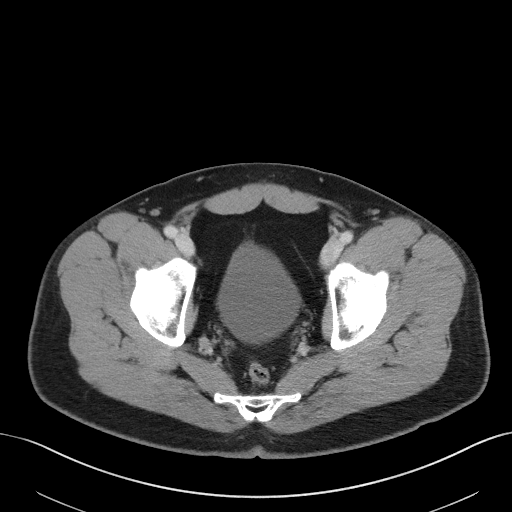
[im 34/98  soft-tissue]
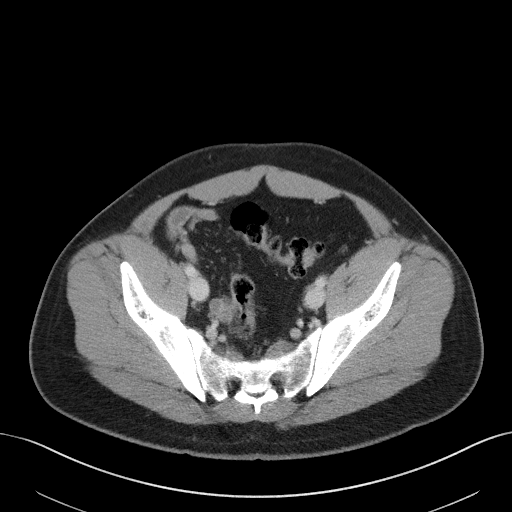
[im 42/98  soft-tissue]
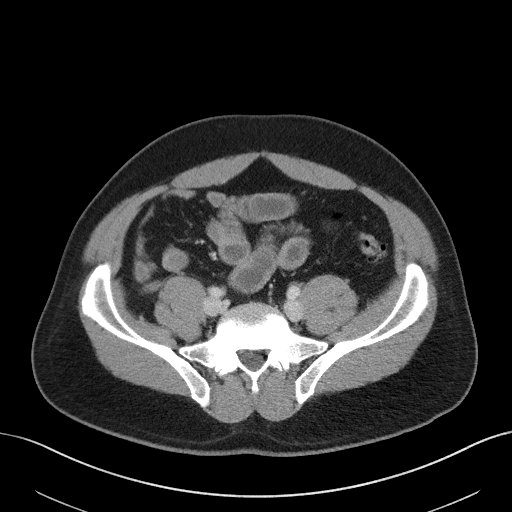
[im 49/98  soft-tissue]
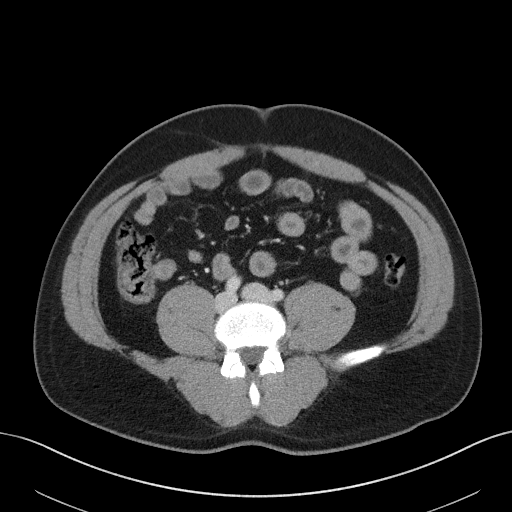
[im 56/98  soft-tissue]
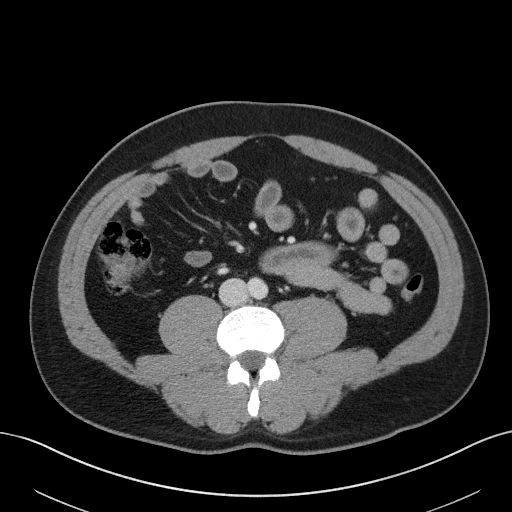
[im 64/98  soft-tissue]
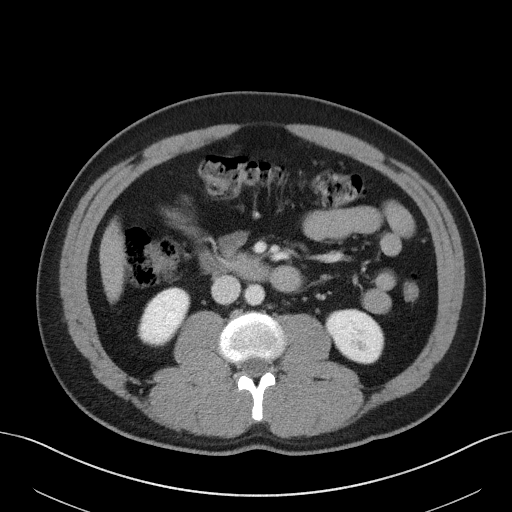
[im 64/98  bone]
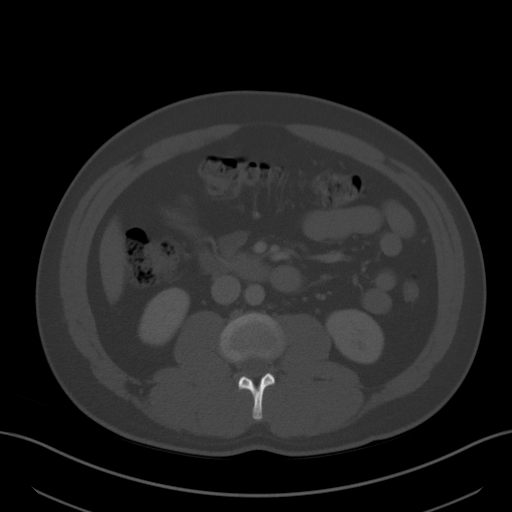
[im 71/98  soft-tissue]
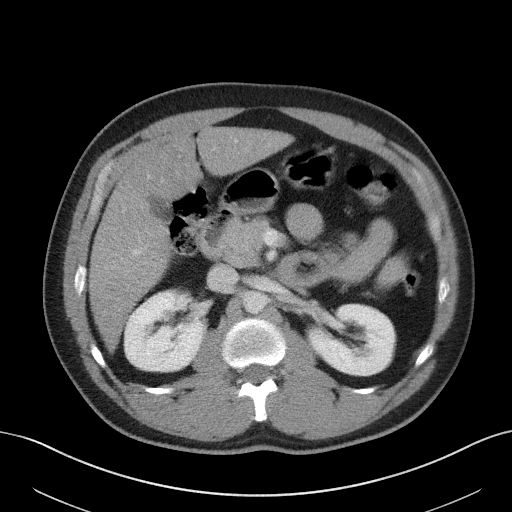
[im 79/98  soft-tissue]
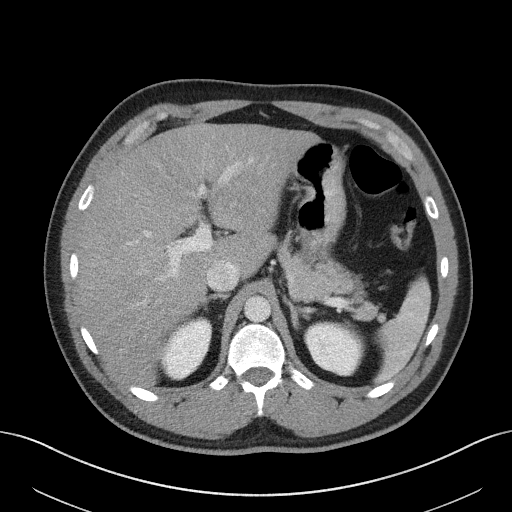
[im 86/98  soft-tissue]
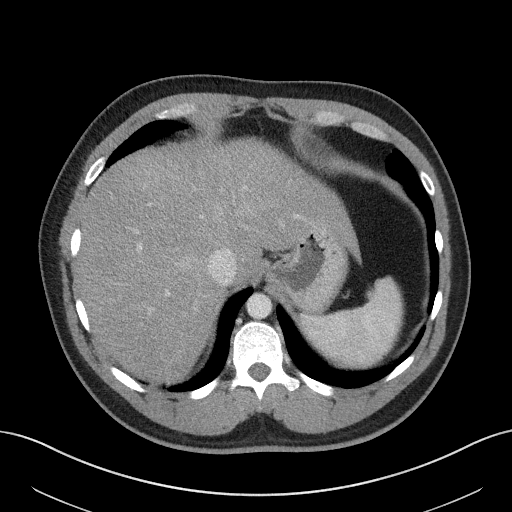
[im 94/98  soft-tissue]
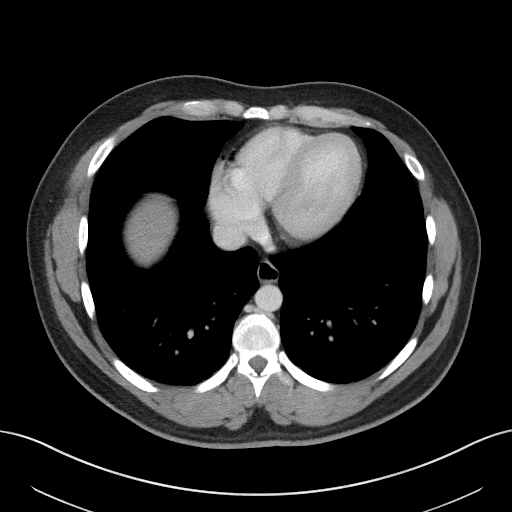

[Series 5: coronal st · coronal · 0.76mm/px · 3 of 101 slices shown]
[im 34/101  soft-tissue]
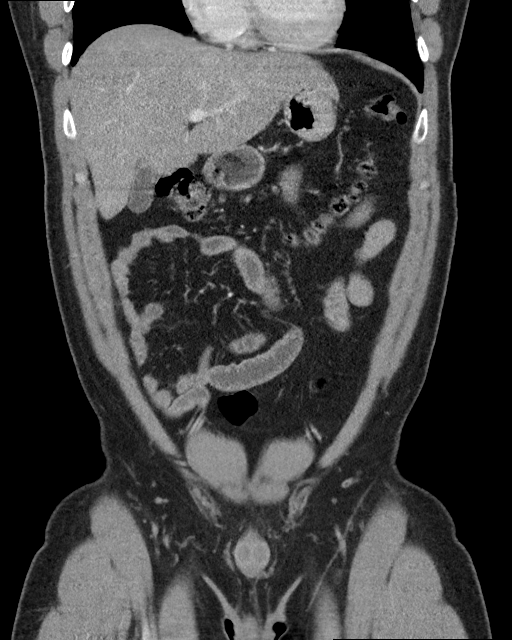
[im 45/101  soft-tissue]
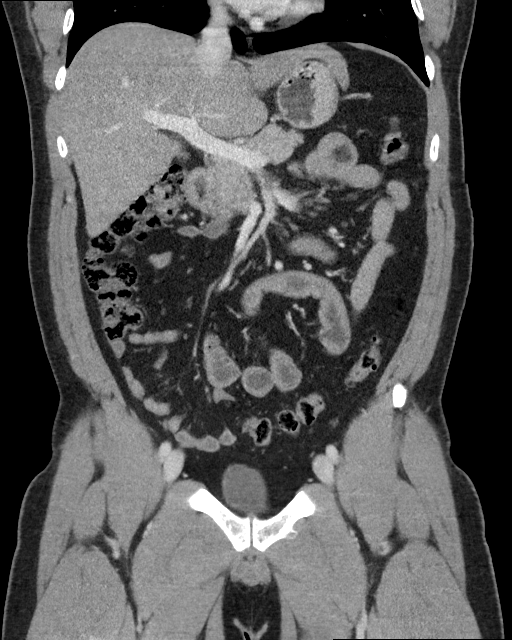
[im 56/101  soft-tissue]
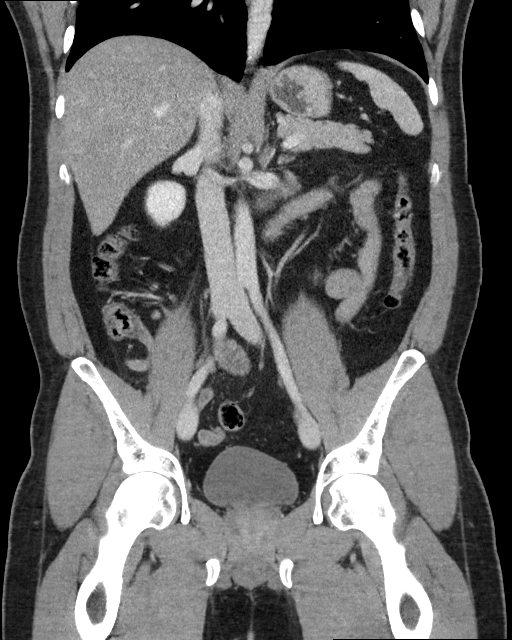

[16 of 46 positions shown; findings below may reference images not displayed]

FINDINGS: Lower chest: Hypoventilatory atelectasis in the dependent lungs with
subsegmental lingular atelectasis. No confluent airspace disease or
pleural effusion.

Hepatobiliary: Diffusely decreased hepatic density typical of
steatosis. No evidence of focal hepatic lesion. Gallbladder
physiologically distended, no calcified stone. No biliary
dilatation.

Pancreas: No ductal dilatation or inflammation.

Spleen: Normal in size without focal abnormality.

Adrenals/Urinary Tract: Normal adrenal glands. No hydronephrosis or
perinephric edema. Homogeneous renal enhancement. Urinary bladder is
physiologically distended without wall thickening.

Stomach/Bowel: Early acute appendicitis as described below. Tiny
hiatal hernia, stomach otherwise unremarkable. There is no small
bowel obstruction or inflammatory change. Terminal ileum is normal.
Small volume of colonic stool. No colonic wall thickening or
inflammatory change.

Appendix: Location: Retrocecal

Diameter: 8 mm

Appendicolith: No

Mucosal hyper-enhancement: No

Extraluminal gas: No

Periappendiceal collection: Faint periappendiceal fat stranding but
no collection or free fluid.

Vascular/Lymphatic: Normal caliber abdominal aorta. Patent portal
vein. Mesenteric vessels are patent. No abdominopelvic adenopathy.

Reproductive: Prostate is unremarkable.

Other: No free air, free fluid, or intra-abdominal fluid collection.

Musculoskeletal: There are no acute or suspicious osseous
abnormalities.
IMPRESSION: 1. Findings consistent with early uncomplicated acute appendicitis.
2. Hepatic steatosis.
